# Patient Record
Sex: Male | Born: 2000 | Hispanic: No | Marital: Single | Smoking: Never smoker
Health system: Southern US, Community
[De-identification: ages and names within clinical notes are randomized; demographics above are authoritative.]

## PROBLEM LIST (undated history)

## (undated) ENCOUNTER — Emergency Department: Admission: EM | Payer: BLUE CROSS/BLUE SHIELD | Source: Home / Self Care

---
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
## 2021-03-15 ENCOUNTER — Ambulatory Visit
Admission: EM | Admit: 2021-03-15 | Discharge: 2021-03-15 | Disposition: A | Discharge status: Home / Self Care | Payer: BLUE CROSS/BLUE SHIELD

## 2021-03-15 ENCOUNTER — Other Ambulatory Visit: Payer: Self-pay

## 2021-03-15 DIAGNOSIS — T169XXA Foreign body in ear, unspecified ear, initial encounter: Secondary | ICD-10-CM

## 2021-03-15 NOTE — ED Triage Notes (Signed)
Pt has a ear bud piece stuck in ear. Easily visualized and removed with [place]. Pt has no additional discomfort. Discharged without seeing a provider, as it was not needed and confirmed by provider that a nurse visit is appropriate.
